# Patient Record
Sex: Male | Born: 1945 | Race: White | Hispanic: No | Marital: Married | Smoking: Never smoker
Health system: Southern US, Community
[De-identification: ages and names within clinical notes are randomized; demographics above are authoritative.]

## PROBLEM LIST (undated history)

## (undated) DIAGNOSIS — E785 Hyperlipidemia, unspecified: Secondary | ICD-10-CM

## (undated) DIAGNOSIS — I10 Essential (primary) hypertension: Secondary | ICD-10-CM

## (undated) DIAGNOSIS — C801 Malignant (primary) neoplasm, unspecified: Secondary | ICD-10-CM

## (undated) HISTORY — PX: INGUINAL HERNIA REPAIR: SHX194

## (undated) HISTORY — PX: LAPAROSCOPIC RETROPUBIC PROSTATECTOMY: SUR794

## (undated) HISTORY — PX: HERNIA REPAIR: SHX51

---

## 2013-01-16 ENCOUNTER — Ambulatory Visit: Payer: Self-pay | Admitting: Urology

## 2013-01-19 ENCOUNTER — Ambulatory Visit: Payer: Self-pay | Admitting: Urology

## 2014-06-02 IMAGING — CT CT ABD-PELV W/ CM
1 of 3 series · 12 of 32 positions shown, 18 images · non-contrast
Comparison: none

REASON FOR EXAM: and delays  prostate cancer
COMMENTS:

PROCEDURE:     KCT - KCT ABDOMEN/PELVIS W  - January 16, 2013  [DATE]
RESULT:
TECHNIQUE: Helical 3 mm sections were obtained from the lung bases through
the pubic symphysis status post intravenous administration of 85 ml of
Ysovue-VBJ.

[Series 2: abd 3mm w 3.0 i40f 3 · axial · 0.76mm/px · z∈[-403,-70]mm · 12 of 131 slices shown, 18 images]
[im 10/131  soft-tissue]
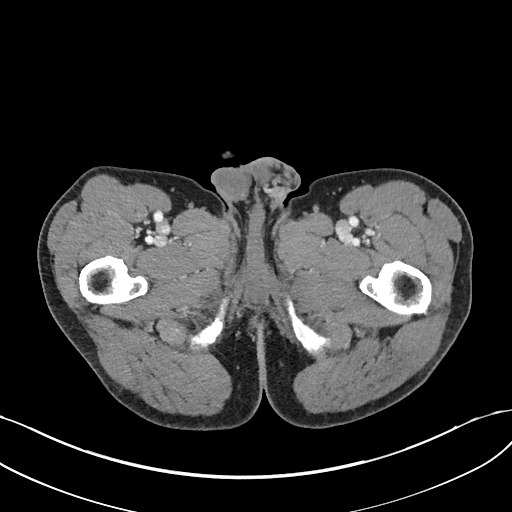
[im 10/131  bone]
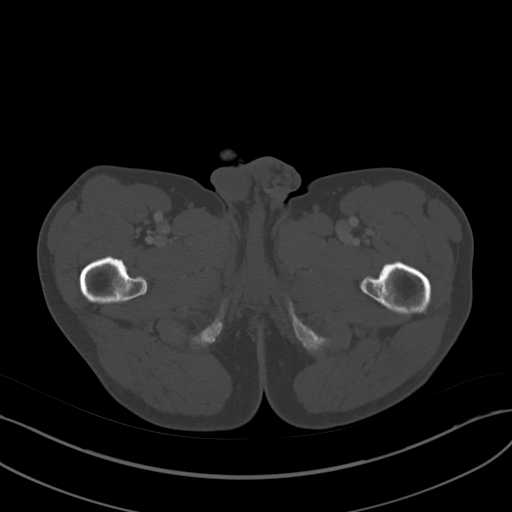
[im 19/131  soft-tissue]
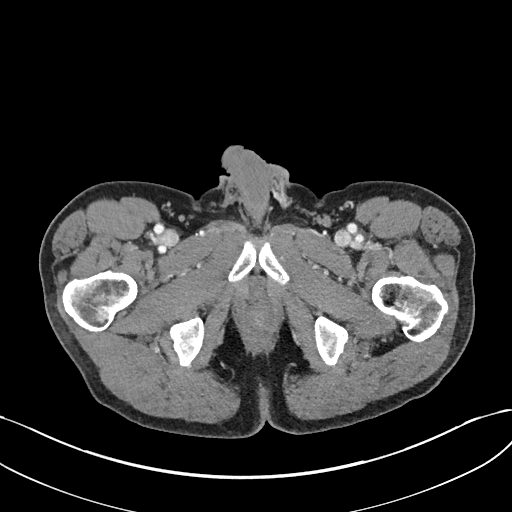
[im 28/131  soft-tissue]
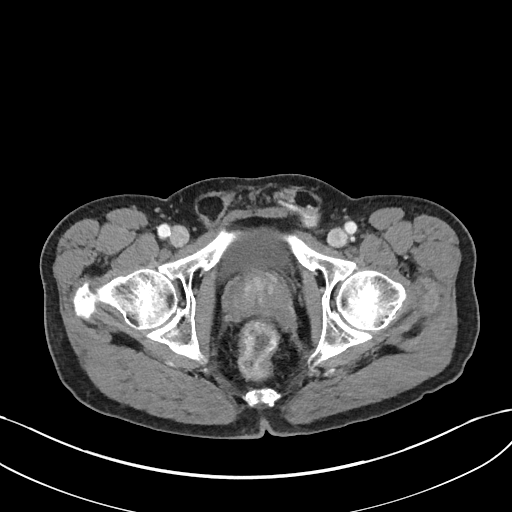
[im 38/131  soft-tissue]
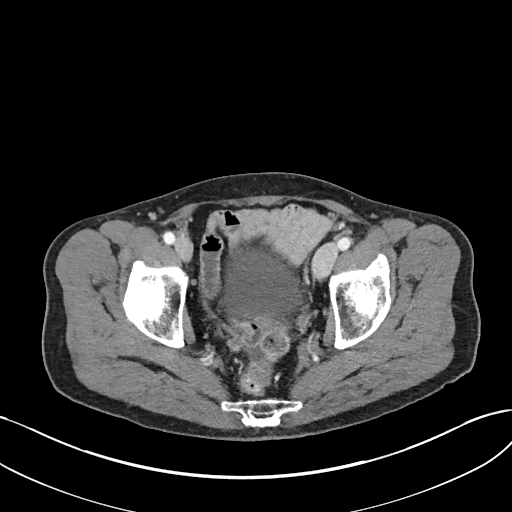
[im 47/131  soft-tissue]
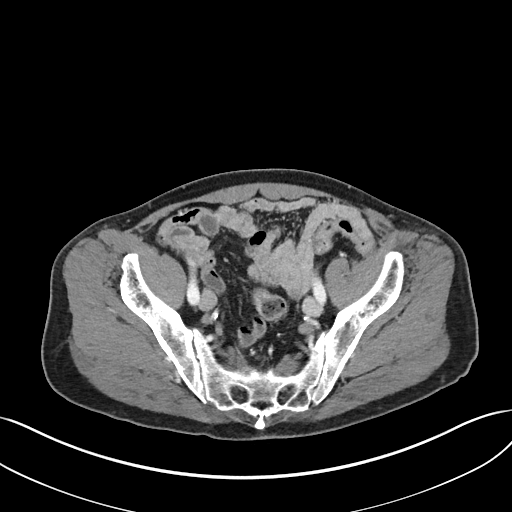
[im 56/131  soft-tissue]
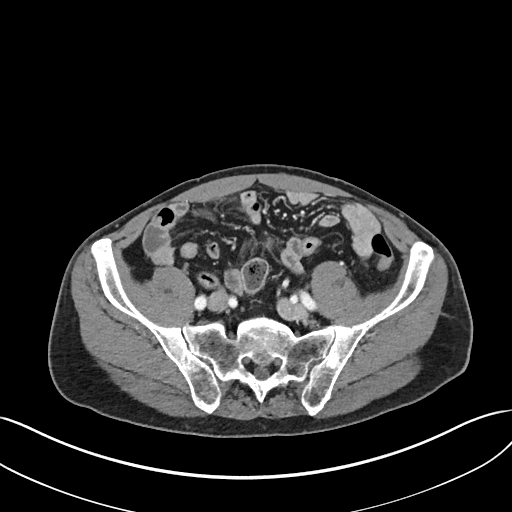
[im 75/131  soft-tissue]
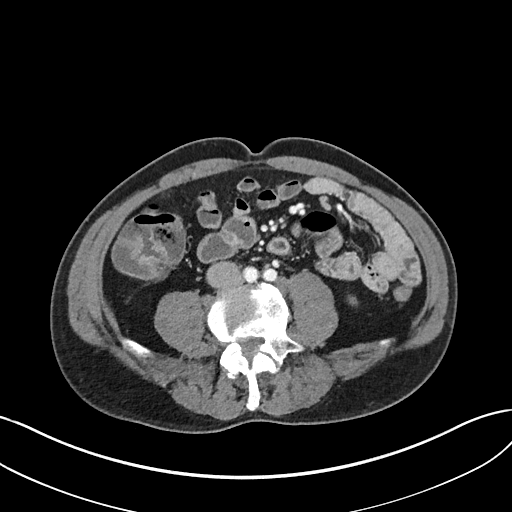
[im 84/131  soft-tissue]
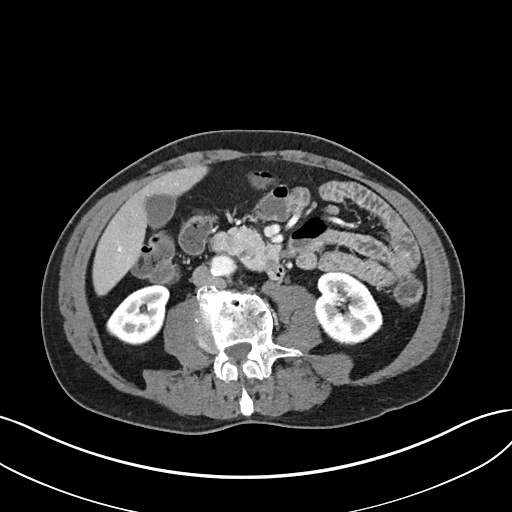
[im 93/131  soft-tissue]
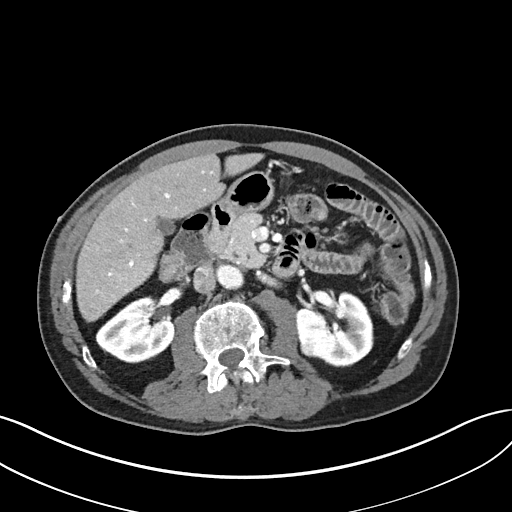
[im 93/131  lung]
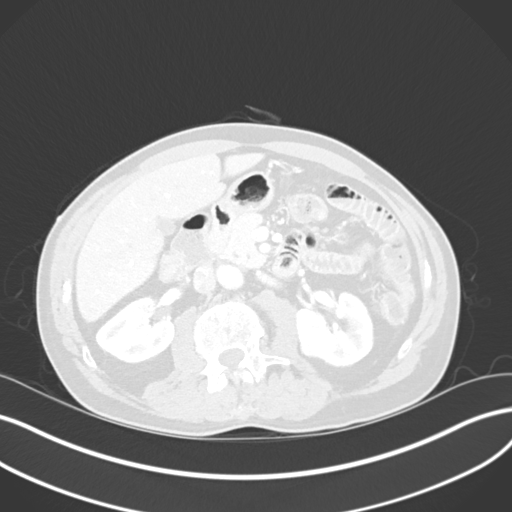
[im 93/131  bone]
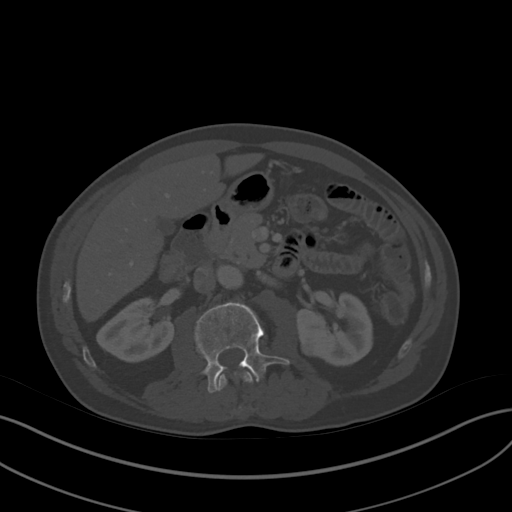
[im 103/131  soft-tissue]
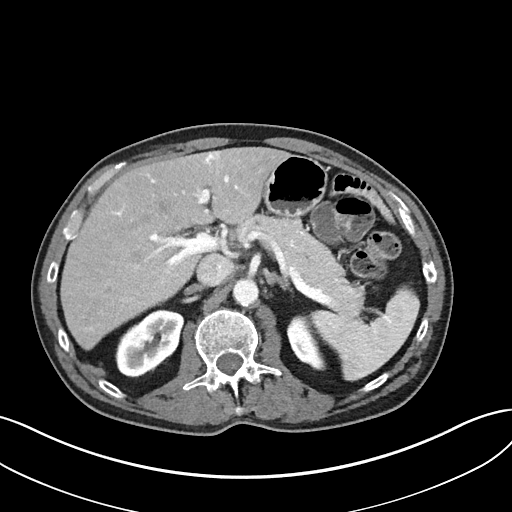
[im 103/131  lung]
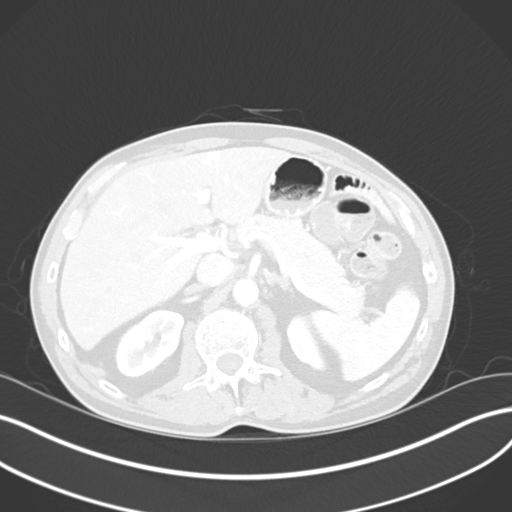
[im 112/131  soft-tissue]
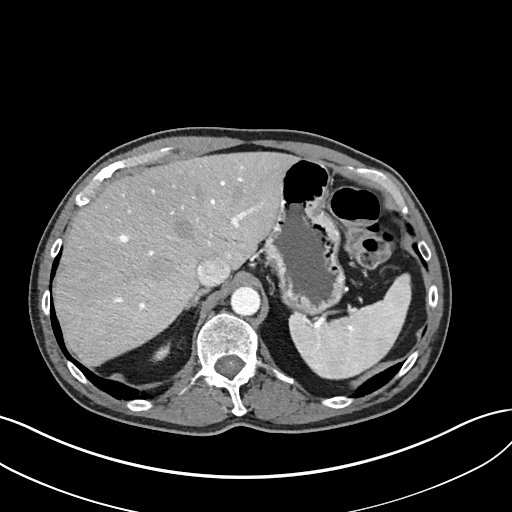
[im 112/131  lung]
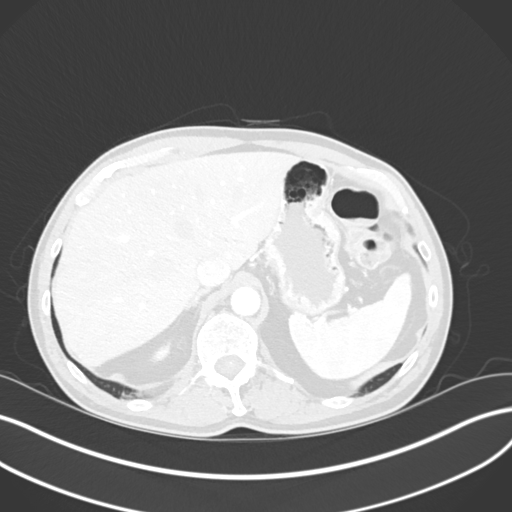
[im 121/131  soft-tissue]
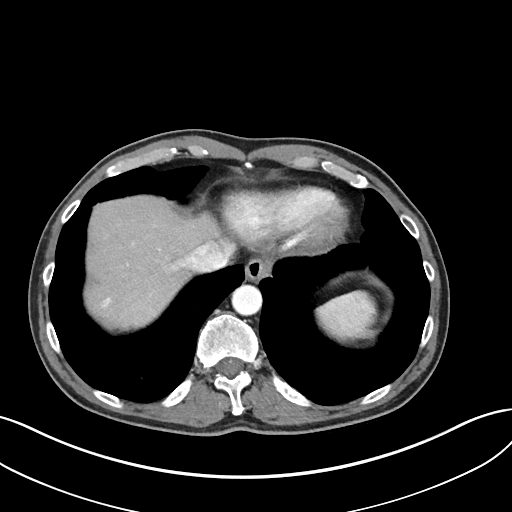
[im 121/131  lung]
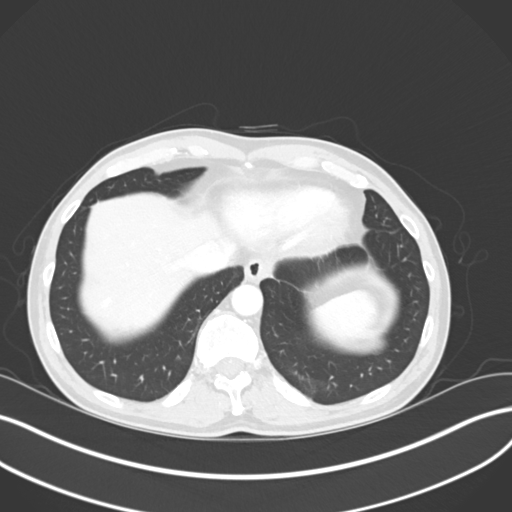

[12 of 32 positions shown; findings below may reference images not displayed]

FINDINGS: The lung bases are unremarkable.

The liver, spleen, adrenals, and pancreas are unremarkable. The right kidney
appears unremarkable. Evaluation of the left kidney demonstrates a 1.37 cm
enhancing nodule within the medial aspect of the midpole of the left kidney.
The CT characteristics are concerning for an enhancing soft tissue mass. No
further renal masses are identified. There is no evidence of hydronephrosis,
hydroureter or nephrolithiasis. There is no CT evidence of bowel
obstruction, enteritis, colitis or diverticulitis. Evaluation of the pelvis
demonstrates a prominent heterogeneously enhancing prostate. Evaluation of
the seminal vesicles demonstrates asymmetric enlargement of the left seminal
vesicle with mild enhancement. The patient has reported a history of
prostate cancer and these findings are concerning for extension. Small
subcentimeter lymph nodes are identified within the soft tissues of the
pelvis just lateral to the seminal vesicles. No further evidence of masses
or adenopathy is appreciated.

Evaluation of the osseous structures demonstrates a mixed sclerotic and
lytic area within the posterior aspect of the iliac wings bilaterally. These
findings are in a subchondral location and have an indeterminate appearance.
Surveillance evaluation and clinical correlation is recommended. These
findings are appreciated on image number 78 on the left and 77 on the right
of the bone windows. Degenerative changes are identified throughout the
lumbar spine.
IMPRESSION: 1.  Findings consistent with a small enhancing nodule within the left
kidney. Differential considerations are metastatic disease considering the
patient's history though primary soft tissue masses, malignant versus
benign, are also of consideration.
2.  Findings concerning for extension of the patient's known prostate cancer
into the left seminal vesicle.
3.  Slightly prominent lymph nodes within the pelvis.
4.  Mixed sclerotic and lytic areas within the iliac wings.

## 2016-11-03 DIAGNOSIS — E7800 Pure hypercholesterolemia, unspecified: Secondary | ICD-10-CM | POA: Diagnosis not present

## 2016-11-03 DIAGNOSIS — Z8546 Personal history of malignant neoplasm of prostate: Secondary | ICD-10-CM | POA: Diagnosis not present

## 2016-11-03 DIAGNOSIS — I10 Essential (primary) hypertension: Secondary | ICD-10-CM | POA: Diagnosis not present

## 2016-12-20 DIAGNOSIS — C61 Malignant neoplasm of prostate: Secondary | ICD-10-CM | POA: Diagnosis not present

## 2017-05-05 DIAGNOSIS — I10 Essential (primary) hypertension: Secondary | ICD-10-CM | POA: Diagnosis not present

## 2017-05-05 DIAGNOSIS — Z1211 Encounter for screening for malignant neoplasm of colon: Secondary | ICD-10-CM | POA: Diagnosis not present

## 2017-06-23 DIAGNOSIS — K640 First degree hemorrhoids: Secondary | ICD-10-CM | POA: Diagnosis not present

## 2017-06-23 DIAGNOSIS — K648 Other hemorrhoids: Secondary | ICD-10-CM | POA: Diagnosis not present

## 2017-06-23 DIAGNOSIS — D126 Benign neoplasm of colon, unspecified: Secondary | ICD-10-CM | POA: Diagnosis not present

## 2017-06-23 DIAGNOSIS — Z1211 Encounter for screening for malignant neoplasm of colon: Secondary | ICD-10-CM | POA: Diagnosis not present

## 2017-06-23 DIAGNOSIS — D124 Benign neoplasm of descending colon: Secondary | ICD-10-CM | POA: Diagnosis not present

## 2017-08-24 DIAGNOSIS — C61 Malignant neoplasm of prostate: Secondary | ICD-10-CM | POA: Diagnosis not present

## 2017-10-06 DIAGNOSIS — N393 Stress incontinence (female) (male): Secondary | ICD-10-CM | POA: Diagnosis not present

## 2017-11-02 DIAGNOSIS — Z8546 Personal history of malignant neoplasm of prostate: Secondary | ICD-10-CM | POA: Diagnosis not present

## 2017-11-02 DIAGNOSIS — E7800 Pure hypercholesterolemia, unspecified: Secondary | ICD-10-CM | POA: Diagnosis not present

## 2017-11-02 DIAGNOSIS — I10 Essential (primary) hypertension: Secondary | ICD-10-CM | POA: Diagnosis not present

## 2017-11-08 DIAGNOSIS — G2581 Restless legs syndrome: Secondary | ICD-10-CM | POA: Diagnosis not present

## 2017-11-08 DIAGNOSIS — Z0000 Encounter for general adult medical examination without abnormal findings: Secondary | ICD-10-CM | POA: Diagnosis not present

## 2017-11-08 DIAGNOSIS — I10 Essential (primary) hypertension: Secondary | ICD-10-CM | POA: Diagnosis not present

## 2017-11-08 DIAGNOSIS — N393 Stress incontinence (female) (male): Secondary | ICD-10-CM | POA: Diagnosis not present

## 2017-11-08 DIAGNOSIS — E7800 Pure hypercholesterolemia, unspecified: Secondary | ICD-10-CM | POA: Diagnosis not present

## 2017-12-07 DIAGNOSIS — N393 Stress incontinence (female) (male): Secondary | ICD-10-CM | POA: Diagnosis not present

## 2018-01-07 DIAGNOSIS — N393 Stress incontinence (female) (male): Secondary | ICD-10-CM | POA: Diagnosis not present

## 2018-02-08 DIAGNOSIS — N393 Stress incontinence (female) (male): Secondary | ICD-10-CM | POA: Diagnosis not present

## 2018-02-13 DIAGNOSIS — D485 Neoplasm of uncertain behavior of skin: Secondary | ICD-10-CM | POA: Diagnosis not present

## 2018-02-13 DIAGNOSIS — C441292 Squamous cell carcinoma of skin of left lower eyelid, including canthus: Secondary | ICD-10-CM | POA: Diagnosis not present

## 2018-03-01 DIAGNOSIS — C61 Malignant neoplasm of prostate: Secondary | ICD-10-CM | POA: Diagnosis not present

## 2018-03-10 DIAGNOSIS — N393 Stress incontinence (female) (male): Secondary | ICD-10-CM | POA: Diagnosis not present

## 2018-03-22 DIAGNOSIS — L578 Other skin changes due to chronic exposure to nonionizing radiation: Secondary | ICD-10-CM | POA: Diagnosis not present

## 2018-03-22 DIAGNOSIS — L821 Other seborrheic keratosis: Secondary | ICD-10-CM | POA: Diagnosis not present

## 2018-03-22 DIAGNOSIS — C441292 Squamous cell carcinoma of skin of left lower eyelid, including canthus: Secondary | ICD-10-CM | POA: Diagnosis not present

## 2018-03-22 DIAGNOSIS — L814 Other melanin hyperpigmentation: Secondary | ICD-10-CM | POA: Diagnosis not present

## 2018-03-31 DIAGNOSIS — S56911A Strain of unspecified muscles, fascia and tendons at forearm level, right arm, initial encounter: Secondary | ICD-10-CM | POA: Diagnosis not present

## 2018-03-31 DIAGNOSIS — S46911A Strain of unspecified muscle, fascia and tendon at shoulder and upper arm level, right arm, initial encounter: Secondary | ICD-10-CM | POA: Diagnosis not present

## 2018-04-08 DIAGNOSIS — N393 Stress incontinence (female) (male): Secondary | ICD-10-CM | POA: Diagnosis not present

## 2018-05-09 DIAGNOSIS — N393 Stress incontinence (female) (male): Secondary | ICD-10-CM | POA: Diagnosis not present

## 2018-06-07 DIAGNOSIS — N393 Stress incontinence (female) (male): Secondary | ICD-10-CM | POA: Diagnosis not present

## 2018-07-08 DIAGNOSIS — N393 Stress incontinence (female) (male): Secondary | ICD-10-CM | POA: Diagnosis not present

## 2018-08-07 DIAGNOSIS — N393 Stress incontinence (female) (male): Secondary | ICD-10-CM | POA: Diagnosis not present

## 2018-09-08 DIAGNOSIS — N393 Stress incontinence (female) (male): Secondary | ICD-10-CM | POA: Diagnosis not present

## 2018-09-11 DIAGNOSIS — D485 Neoplasm of uncertain behavior of skin: Secondary | ICD-10-CM | POA: Diagnosis not present

## 2018-09-11 DIAGNOSIS — C44321 Squamous cell carcinoma of skin of nose: Secondary | ICD-10-CM | POA: Diagnosis not present

## 2018-09-11 DIAGNOSIS — B078 Other viral warts: Secondary | ICD-10-CM | POA: Diagnosis not present

## 2018-09-11 DIAGNOSIS — L570 Actinic keratosis: Secondary | ICD-10-CM | POA: Diagnosis not present

## 2018-09-11 DIAGNOSIS — L821 Other seborrheic keratosis: Secondary | ICD-10-CM | POA: Diagnosis not present

## 2018-10-04 DIAGNOSIS — C44321 Squamous cell carcinoma of skin of nose: Secondary | ICD-10-CM | POA: Diagnosis not present

## 2018-10-04 DIAGNOSIS — C44329 Squamous cell carcinoma of skin of other parts of face: Secondary | ICD-10-CM | POA: Diagnosis not present

## 2018-10-05 DIAGNOSIS — N393 Stress incontinence (female) (male): Secondary | ICD-10-CM | POA: Diagnosis not present

## 2018-11-02 DIAGNOSIS — I10 Essential (primary) hypertension: Secondary | ICD-10-CM | POA: Diagnosis not present

## 2018-11-02 DIAGNOSIS — E7800 Pure hypercholesterolemia, unspecified: Secondary | ICD-10-CM | POA: Diagnosis not present

## 2018-11-04 DIAGNOSIS — N393 Stress incontinence (female) (male): Secondary | ICD-10-CM | POA: Diagnosis not present

## 2018-11-07 DIAGNOSIS — Z8546 Personal history of malignant neoplasm of prostate: Secondary | ICD-10-CM | POA: Diagnosis not present

## 2018-11-07 DIAGNOSIS — Z125 Encounter for screening for malignant neoplasm of prostate: Secondary | ICD-10-CM | POA: Diagnosis not present

## 2018-11-09 DIAGNOSIS — Z0000 Encounter for general adult medical examination without abnormal findings: Secondary | ICD-10-CM | POA: Diagnosis not present

## 2018-12-04 DIAGNOSIS — N393 Stress incontinence (female) (male): Secondary | ICD-10-CM | POA: Diagnosis not present

## 2019-01-05 DIAGNOSIS — N393 Stress incontinence (female) (male): Secondary | ICD-10-CM | POA: Diagnosis not present

## 2019-01-18 DIAGNOSIS — C61 Malignant neoplasm of prostate: Secondary | ICD-10-CM | POA: Diagnosis not present

## 2019-02-08 DIAGNOSIS — N393 Stress incontinence (female) (male): Secondary | ICD-10-CM | POA: Diagnosis not present

## 2019-03-08 DIAGNOSIS — Z125 Encounter for screening for malignant neoplasm of prostate: Secondary | ICD-10-CM | POA: Diagnosis not present

## 2019-03-08 DIAGNOSIS — Z8546 Personal history of malignant neoplasm of prostate: Secondary | ICD-10-CM | POA: Diagnosis not present

## 2019-03-27 DIAGNOSIS — N393 Stress incontinence (female) (male): Secondary | ICD-10-CM | POA: Diagnosis not present

## 2019-04-05 DIAGNOSIS — Z86018 Personal history of other benign neoplasm: Secondary | ICD-10-CM | POA: Diagnosis not present

## 2019-04-05 DIAGNOSIS — L814 Other melanin hyperpigmentation: Secondary | ICD-10-CM | POA: Diagnosis not present

## 2019-04-05 DIAGNOSIS — Z859 Personal history of malignant neoplasm, unspecified: Secondary | ICD-10-CM | POA: Diagnosis not present

## 2019-04-05 DIAGNOSIS — L578 Other skin changes due to chronic exposure to nonionizing radiation: Secondary | ICD-10-CM | POA: Diagnosis not present

## 2019-05-02 DIAGNOSIS — N393 Stress incontinence (female) (male): Secondary | ICD-10-CM | POA: Diagnosis not present

## 2023-01-10 ENCOUNTER — Encounter: Payer: Self-pay | Admitting: *Deleted

## 2023-01-11 ENCOUNTER — Ambulatory Visit
Admission: RE | Admit: 2023-01-11 | Discharge: 2023-01-11 | Disposition: A | Discharge status: Home / Self Care | Payer: Medicare Other | Attending: Gastroenterology | Admitting: Gastroenterology

## 2023-01-11 ENCOUNTER — Encounter: Payer: Self-pay | Admitting: *Deleted

## 2023-01-11 ENCOUNTER — Ambulatory Visit: Payer: Medicare Other | Admitting: Certified Registered"

## 2023-01-11 ENCOUNTER — Encounter
Admission: RE | Disposition: A | Discharge status: Home / Self Care | Payer: Self-pay | Source: Home / Self Care | Attending: Gastroenterology

## 2023-01-11 DIAGNOSIS — I10 Essential (primary) hypertension: Secondary | ICD-10-CM | POA: Insufficient documentation

## 2023-01-11 DIAGNOSIS — Z1211 Encounter for screening for malignant neoplasm of colon: Secondary | ICD-10-CM | POA: Diagnosis present

## 2023-01-11 DIAGNOSIS — Z09 Encounter for follow-up examination after completed treatment for conditions other than malignant neoplasm: Secondary | ICD-10-CM | POA: Insufficient documentation

## 2023-01-11 DIAGNOSIS — E785 Hyperlipidemia, unspecified: Secondary | ICD-10-CM | POA: Insufficient documentation

## 2023-01-11 DIAGNOSIS — Z86010 Personal history of colonic polyps: Secondary | ICD-10-CM | POA: Diagnosis not present

## 2023-01-11 DIAGNOSIS — Z8546 Personal history of malignant neoplasm of prostate: Secondary | ICD-10-CM | POA: Diagnosis not present

## 2023-01-11 DIAGNOSIS — D123 Benign neoplasm of transverse colon: Secondary | ICD-10-CM | POA: Diagnosis not present

## 2023-01-11 DIAGNOSIS — Z08 Encounter for follow-up examination after completed treatment for malignant neoplasm: Secondary | ICD-10-CM | POA: Diagnosis not present

## 2023-01-11 HISTORY — PX: COLONOSCOPY WITH PROPOFOL: SHX5780

## 2023-01-11 HISTORY — DX: Malignant (primary) neoplasm, unspecified: C80.1

## 2023-01-11 HISTORY — DX: Essential (primary) hypertension: I10

## 2023-01-11 HISTORY — DX: Hyperlipidemia, unspecified: E78.5

## 2023-01-11 SURGERY — COLONOSCOPY WITH PROPOFOL
Anesthesia: General

## 2023-01-11 MED ORDER — SODIUM CHLORIDE 0.9 % IV SOLN: INTRAVENOUS | Status: DC

## 2023-01-11 MED ORDER — PROPOFOL 10 MG/ML IV BOLUS
INTRAVENOUS | Status: DC | PRN
  Administered 2023-01-11: 70 mg via INTRAVENOUS
  Administered 2023-01-11: 30 mg via INTRAVENOUS

## 2023-01-11 MED ORDER — PROPOFOL 500 MG/50ML IV EMUL
INTRAVENOUS | Status: DC | PRN
  Administered 2023-01-11: 120 ug/kg/min via INTRAVENOUS

## 2023-01-11 MED ORDER — LIDOCAINE 2% (20 MG/ML) 5 ML SYRINGE
INTRAMUSCULAR | Status: DC | PRN
  Administered 2023-01-11: 20 mg via INTRAVENOUS

## 2023-01-11 NOTE — Anesthesia Postprocedure Evaluation (Signed)
Anesthesia Post Note  Patient: Gary Snyder  Procedure(s) Performed: COLONOSCOPY WITH PROPOFOL  Patient location during evaluation: Endoscopy Anesthesia Type: General Level of consciousness: awake and alert Pain management: pain level controlled Vital Signs Assessment: post-procedure vital signs reviewed and stable Respiratory status: spontaneous breathing, nonlabored ventilation, respiratory function stable and patient connected to nasal cannula oxygen Cardiovascular status: blood pressure returned to baseline and stable Postop Assessment: no apparent nausea or vomiting Anesthetic complications: no   No notable events documented.   Last Vitals:  Vitals:   01/11/23 0842 01/11/23 0852  BP: (!) 83/58 (!) 87/60  Pulse: 71   Resp: 19   Temp: (!) 36.4 C   SpO2: 94%     Last Pain:  Vitals:   01/11/23 0902  TempSrc:   PainSc: 0-No pain                 Corinda Gubler

## 2023-01-11 NOTE — Anesthesia Preprocedure Evaluation (Signed)
Anesthesia Evaluation  Patient identified by MRN, date of birth, ID band Patient awake    Reviewed: Allergy & Precautions, NPO status , Patient's Chart, lab work & pertinent test results  History of Anesthesia Complications Negative for: history of anesthetic complications  Airway Mallampati: II  TM Distance: >3 FB Neck ROM: Full    Dental no notable dental hx. (+) Teeth Intact   Pulmonary neg pulmonary ROS, neg sleep apnea, neg COPD, Patient abstained from smoking.Not current smoker   Pulmonary exam normal breath sounds clear to auscultation       Cardiovascular Exercise Tolerance: Good METShypertension, Pt. on medications (-) CAD and (-) Past MI (-) dysrhythmias  Rhythm:Regular Rate:Normal - Systolic murmurs    Neuro/Psych negative neurological ROS  negative psych ROS   GI/Hepatic ,neg GERD  ,,(+)     (-) substance abuse    Endo/Other  neg diabetes    Renal/GU negative Renal ROS     Musculoskeletal   Abdominal   Peds  Hematology   Anesthesia Other Findings Past Medical History: No date: Cancer (HCC)     Comment:  skin cancer and history of prostate cancer No date: HLD (hyperlipidemia) No date: Hypertension  Reproductive/Obstetrics                             Anesthesia Physical Anesthesia Plan  ASA: 2  Anesthesia Plan: General   Post-op Pain Management: Minimal or no pain anticipated   Induction: Intravenous  PONV Risk Score and Plan: 2 and Propofol infusion, TIVA and Ondansetron  Airway Management Planned: Nasal Cannula  Additional Equipment: None  Intra-op Plan:   Post-operative Plan:   Informed Consent: I have reviewed the patients History and Physical, chart, labs and discussed the procedure including the risks, benefits and alternatives for the proposed anesthesia with the patient or authorized representative who has indicated his/her understanding and  acceptance.     Dental advisory given  Plan Discussed with: CRNA and Surgeon  Anesthesia Plan Comments: (Discussed risks of anesthesia with patient, including possibility of difficulty with spontaneous ventilation under anesthesia necessitating airway intervention, PONV, and rare risks such as cardiac or respiratory or neurological events, and allergic reactions. Discussed the role of CRNA in patient's perioperative care. Patient understands.)       Anesthesia Quick Evaluation

## 2023-01-11 NOTE — Interval H&P Note (Signed)
History and Physical Interval Note:  01/11/2023 8:12 AM  Gary Snyder  has presented today for surgery, with the diagnosis of h/o TA Polyps.  The various methods of treatment have been discussed with the patient and family. After consideration of risks, benefits and other options for treatment, the patient has consented to  Procedure(s): COLONOSCOPY WITH PROPOFOL (N/A) as a surgical intervention.  The patient's history has been reviewed, patient examined, no change in status, stable for surgery.  I have reviewed the patient's chart and labs.  Questions were answered to the patient's satisfaction.     Regis Bill  Ok to proceed with colonoscopy

## 2023-01-11 NOTE — Transfer of Care (Signed)
Immediate Anesthesia Transfer of Care Note  Patient: Gary Snyder  Procedure(s) Performed: COLONOSCOPY WITH PROPOFOL  Patient Location: PACU  Anesthesia Type:General  Level of Consciousness: drowsy  Airway & Oxygen Therapy: Patient Spontanous Breathing  Post-op Assessment: Report given to RN and Post -op Vital signs reviewed and stable  Post vital signs: Reviewed  Last Vitals:  Vitals Value Taken Time  BP 83/58 01/11/23 0843  Temp    Pulse 65 01/11/23 0844  Resp 21 01/11/23 0844  SpO2 94 % 01/11/23 0844  Vitals shown include unvalidated device data.  Last Pain:  Vitals:   01/11/23 0756  TempSrc: Temporal  PainSc: 0-No pain         Complications: No notable events documented.

## 2023-01-11 NOTE — Op Note (Addendum)
[facility] Patient Name: Gary Snyder Procedure Date: 01/11/2023 7:59 AM MRN: [id number] Account #: [id number] Date of Birth: [birth year] Admit Type: Outpatient Age: 77 Room: [facility] ENDO ROOM 1 Gender: Male Note Status: Finalized Instrument Name: Peds Colonoscope [id number] Procedure:             Colonoscopy Indications:           Surveillance: Personal history of adenomatous polyps                         on last colonoscopy > 5 years ago Providers:             Eather Colas MD, MD Medicines:             Monitored Anesthesia Care Complications:         No immediate complications. Estimated blood loss:                         Minimal. Procedure:             Pre-Anesthesia Assessment:                        - Prior to the procedure, a History and Physical was                         performed, and patient medications and allergies were                         reviewed. The patient is competent. The risks and                         benefits of the procedure and the sedation options and                         risks were discussed with the patient. All questions                         were answered and informed consent was obtained.                         Patient identification and proposed procedure were                         verified by the physician, the nurse, the                         anesthesiologist, the anesthetist and the technician                         in the endoscopy suite. Mental Status Examination:                         alert and oriented. Airway Examination: normal                         oropharyngeal airway and neck mobility. Respiratory                         Examination: clear to auscultation. CV Examination:  normal. Prophylactic Antibiotics: The patient does not                         require prophylactic antibiotics. Prior                         Anticoagulants: The patient has taken no  anticoagulant                         or antiplatelet agents. ASA Grade Assessment: II - A                         patient with mild systemic disease. After reviewing                         the risks and benefits, the patient was deemed in                         satisfactory condition to undergo the procedure. The                         anesthesia plan was to use monitored anesthesia care                         (MAC). Immediately prior to administration of                         medications, the patient was re-assessed for adequacy                         to receive sedatives. The heart rate, respiratory                         rate, oxygen saturations, blood pressure, adequacy of                         pulmonary ventilation, and response to care were                         monitored throughout the procedure. The physical                         status of the patient was re-assessed after the                         procedure.                        After obtaining informed consent, the colonoscope was                         passed under direct vision. Throughout the procedure,                         the patient's blood pressure, pulse, and oxygen                         saturations were monitored continuously. The  Colonoscope was introduced through the anus and                         advanced to the the terminal ileum. The colonoscopy                         was performed without difficulty. The patient                         tolerated the procedure well. The quality of the bowel                         preparation was good. The terminal ileum, ileocecal                         valve, appendiceal orifice, and rectum were                         photographed. Findings:      The perianal and digital rectal examinations were normal.      The terminal ileum appeared normal.      Two sessile polyps were found in the proximal transverse colon. The        polyps were 3 to 4 mm in size. These polyps were removed with a cold       snare. Resection and retrieval were complete. Estimated blood loss was       minimal.      The exam was otherwise without abnormality on direct and retroflexion       views. Impression:            - The examined portion of the ileum was normal.                        - Two 3 to 4 mm polyps in the proximal transverse                         colon, removed with a cold snare. Resected and                         retrieved.                        - The examination was otherwise normal on direct and                         retroflexion views. Recommendation:        - Discharge patient to home.                        - Resume previous diet.                        - Continue present medications.                        - Await pathology results.                        - Repeat colonoscopy date to be determined after  pending pathology results are reviewed for                         surveillance.                        - Return to referring physician as previously                         scheduled. Procedure Code(s):     --- Professional ---                        [id number], Colonoscopy, flexible; with removal of                         tumor(s), polyp(s), or other lesion(s) by snare                         technique Diagnosis Code(s):     --- Professional ---                        Z86.010, Personal history of colonic polyps                        D12.3, Benign neoplasm of transverse colon (hepatic                         flexure or splenic flexure) CPT copyright 2022 [employer]. All rights reserved. The codes documented in this report are preliminary and upon coder review may  be revised to meet current compliance requirements. Eather Colas MD, MD 01/11/2023 8:44:03 AM Number of Addenda: 0 Note Initiated On: 01/11/2023 7:59 AM Scope Withdrawal Time: 0 hours 9 minutes 36  seconds  Total Procedure Duration: 0 hours 18 minutes 13 seconds  Estimated Blood Loss:  Estimated blood loss was minimal.      [facility]

## 2023-01-11 NOTE — H&P (Signed)
Outpatient short stay form Pre-procedure 01/11/2023  Regis Bill, MD  Primary Physician: Patient, No Pcp Per  Reason for visit:  Surveillance colonoscopy  History of present illness:    77 y/o gentleman with history of hypertension and HLD here for surveillance colonoscopy. Last colonoscopy in 2018 with small TA. No blood thinners. No family history of GI malignancies. History of inguinal hernia repair and radical prostatectomy.    Current Facility-Administered Medications:    0.9 %  sodium chloride infusion, , Intravenous, Continuous, Olita Takeshita, Rossie Muskrat, MD, Last Rate: 20 mL/hr at 01/11/23 0808, New Bag at 01/11/23 0808  Medications Prior to Admission  Medication Sig Dispense Refill Last Dose   gabapentin (NEURONTIN) 300 MG capsule Take 300 mg by mouth 3 (three) times daily.      lisinopril (ZESTRIL) 10 MG tablet Take 10 mg by mouth daily.   01/11/2023 at 0615   polyethylene glycol-electrolytes (NULYTELY) 420 g solution Take 4,000 mLs by mouth once.      rosuvastatin (CRESTOR) 5 MG tablet Take 5 mg by mouth daily.        Not on File   Past Medical History:  Diagnosis Date   Cancer [facility])    skin cancer and history of prostate cancer   HLD (hyperlipidemia)    Hypertension     Review of systems:  Otherwise negative.    Physical Exam  Gen: Alert, oriented. Appears stated age.  HEENT: PERRLA. Lungs: No respiratory distress CV: RRR Abd: soft, benign, no masses Ext: No edema    Planned procedures: Proceed with colonoscopy. The patient understands the nature of the planned procedure, indications, risks, alternatives and potential complications including but not limited to bleeding, infection, perforation, damage to internal organs and possible oversedation/side effects from anesthesia. The patient agrees and gives consent to proceed.  Please refer to procedure notes for findings, recommendations and patient disposition/instructions.     Regis Bill,  MD [facility]

## 2023-01-12 ENCOUNTER — Encounter: Payer: Self-pay | Admitting: Gastroenterology

## 2023-01-12 LAB — SURGICAL PATHOLOGY

## 2023-01-18 ENCOUNTER — Encounter: Payer: Self-pay | Admitting: Gastroenterology
